# Patient Record
Sex: Male | Born: 1995 | Race: Black or African American | Hispanic: No | Marital: Single | Smoking: Never smoker
Health system: Southern US, Community
[De-identification: ages and names within clinical notes are randomized; demographics above are authoritative.]

## PROBLEM LIST (undated history)

## (undated) DIAGNOSIS — L309 Dermatitis, unspecified: Secondary | ICD-10-CM

## (undated) DIAGNOSIS — J302 Other seasonal allergic rhinitis: Secondary | ICD-10-CM

---

## 2014-10-12 ENCOUNTER — Emergency Department (HOSPITAL_COMMUNITY)
Admission: EM | Admit: 2014-10-12 | Discharge: 2014-10-13 | Disposition: A | Discharge status: Other Acute Inpatient Hospital | Payer: BC Managed Care – PPO | Attending: Emergency Medicine | Admitting: Emergency Medicine

## 2014-10-12 ENCOUNTER — Encounter (HOSPITAL_COMMUNITY): Payer: Self-pay | Admitting: Emergency Medicine

## 2014-10-12 DIAGNOSIS — F28 Other psychotic disorder not due to a substance or known physiological condition: Secondary | ICD-10-CM

## 2014-10-12 DIAGNOSIS — F1210 Cannabis abuse, uncomplicated: Secondary | ICD-10-CM | POA: Diagnosis not present

## 2014-10-12 DIAGNOSIS — F29 Unspecified psychosis not due to a substance or known physiological condition: Secondary | ICD-10-CM | POA: Diagnosis not present

## 2014-10-12 DIAGNOSIS — Z008 Encounter for other general examination: Secondary | ICD-10-CM | POA: Diagnosis present

## 2014-10-12 DIAGNOSIS — F1910 Other psychoactive substance abuse, uncomplicated: Secondary | ICD-10-CM

## 2014-10-12 LAB — CBC WITH DIFFERENTIAL/PLATELET
Basophils Absolute: 0 10*3/uL (ref 0.0–0.1)
Basophils Relative: 0 % (ref 0–1)
Eosinophils Absolute: 0 10*3/uL (ref 0.0–0.7)
Eosinophils Relative: 0 % (ref 0–5)
HCT: 48.4 % (ref 39.0–52.0)
Hemoglobin: 16.2 g/dL (ref 13.0–17.0)
Lymphocytes Relative: 6 % — ABNORMAL LOW (ref 12–46)
Lymphs Abs: 0.5 10*3/uL — ABNORMAL LOW (ref 0.7–4.0)
MCH: 31.2 pg (ref 26.0–34.0)
MCHC: 33.5 g/dL (ref 30.0–36.0)
MCV: 93.3 fL (ref 78.0–100.0)
Monocytes Absolute: 0.4 10*3/uL (ref 0.1–1.0)
Monocytes Relative: 4 % (ref 3–12)
Neutro Abs: 8.1 10*3/uL — ABNORMAL HIGH (ref 1.7–7.7)
Neutrophils Relative %: 90 % — ABNORMAL HIGH (ref 43–77)
Platelets: 187 10*3/uL (ref 150–400)
RBC: 5.19 MIL/uL (ref 4.22–5.81)
RDW: 12.3 % (ref 11.5–15.5)
WBC: 9 10*3/uL (ref 4.0–10.5)

## 2014-10-12 LAB — COMPREHENSIVE METABOLIC PANEL
ALT: 16 U/L (ref 0–53)
AST: 34 U/L (ref 0–37)
Albumin: 4.6 g/dL (ref 3.5–5.2)
Alkaline Phosphatase: 43 U/L (ref 39–117)
Anion gap: 14 (ref 5–15)
BUN: 9 mg/dL (ref 6–23)
CO2: 21 mmol/L (ref 19–32)
Calcium: 9.3 mg/dL (ref 8.4–10.5)
Chloride: 104 mmol/L (ref 96–112)
Creatinine, Ser: 1.16 mg/dL (ref 0.50–1.35)
GFR calc Af Amer: >90 mL/min (ref >90)
GFR calc non Af Amer: >90 mL/min (ref >90)
Glucose, Bld: 131 mg/dL — ABNORMAL HIGH (ref 70–99)
Potassium: 3.6 mmol/L (ref 3.5–5.1)
Sodium: 139 mmol/L (ref 135–145)
Total Bilirubin: 2.2 mg/dL — ABNORMAL HIGH (ref 0.3–1.2)
Total Protein: 7.3 g/dL (ref 6.0–8.3)

## 2014-10-12 LAB — RAPID URINE DRUG SCREEN, HOSP PERFORMED
Amphetamines: NOT DETECTED
Barbiturates: NOT DETECTED
Benzodiazepines: NOT DETECTED
Cocaine: NOT DETECTED
Opiates: NOT DETECTED
Tetrahydrocannabinol: POSITIVE — AB

## 2014-10-12 LAB — ETHANOL: Alcohol, Ethyl (B): <5 mg/dL (ref 0–9)

## 2014-10-12 MED ORDER — LORAZEPAM 2 MG/ML IJ SOLN
1.0000 mg | Freq: Once | INTRAMUSCULAR | Status: AC
  Administered 2014-10-12: 1 mg via INTRAVENOUS
  Filled 2014-10-12: qty 1
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
## 2014-10-12 NOTE — ED Notes (Signed)
PA at bedside.
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
## 2014-10-12 NOTE — [facility] Counselor (Signed)
Per Donell SievertSpencer Simon, PA, pt to be observed overnight and evaluated in the AM by psychiatry.  TTS Counselor informed attending PA, Junius FinnerErin O'Malley, of disposition. She reports that pt will be IVC'ed.   Cyndie MullAnna Yitzchok Carriger, [facility] Triage Specialist

## 2014-10-13 ENCOUNTER — Inpatient Hospital Stay (HOSPITAL_COMMUNITY)
Admission: AD | Admit: 2014-10-13 | Discharge: 2014-10-19 | DRG: 897 | Disposition: A | Discharge status: Home / Self Care | Payer: BC Managed Care – PPO | Source: Intra-hospital | Attending: Psychiatry | Admitting: Psychiatry

## 2014-10-13 ENCOUNTER — Encounter (HOSPITAL_COMMUNITY): Payer: Self-pay | Admitting: *Deleted

## 2014-10-13 DIAGNOSIS — F1220 Cannabis dependence, uncomplicated: Secondary | ICD-10-CM | POA: Diagnosis present

## 2014-10-13 DIAGNOSIS — F28 Other psychotic disorder not due to a substance or known physiological condition: Secondary | ICD-10-CM | POA: Diagnosis not present

## 2014-10-13 DIAGNOSIS — J302 Other seasonal allergic rhinitis: Secondary | ICD-10-CM | POA: Diagnosis present

## 2014-10-13 DIAGNOSIS — F1990 Other psychoactive substance use, unspecified, uncomplicated: Secondary | ICD-10-CM | POA: Diagnosis not present

## 2014-10-13 DIAGNOSIS — R17 Unspecified jaundice: Secondary | ICD-10-CM

## 2014-10-13 DIAGNOSIS — F12251 Cannabis dependence with psychotic disorder with hallucinations: Secondary | ICD-10-CM | POA: Diagnosis present

## 2014-10-13 DIAGNOSIS — F12122 Cannabis abuse with intoxication with perceptual disturbance: Secondary | ICD-10-CM | POA: Diagnosis not present

## 2014-10-13 DIAGNOSIS — F12129 Cannabis abuse with intoxication, unspecified: Secondary | ICD-10-CM | POA: Diagnosis not present

## 2014-10-13 DIAGNOSIS — F29 Unspecified psychosis not due to a substance or known physiological condition: Secondary | ICD-10-CM | POA: Diagnosis present

## 2014-10-13 DIAGNOSIS — F12951 Cannabis use, unspecified with psychotic disorder with hallucinations: Secondary | ICD-10-CM | POA: Diagnosis not present

## 2014-10-13 DIAGNOSIS — Y909 Presence of alcohol in blood, level not specified: Secondary | ICD-10-CM | POA: Diagnosis not present

## 2014-10-13 DIAGNOSIS — F12988 Cannabis use, unspecified with other cannabis-induced disorder: Secondary | ICD-10-CM | POA: Diagnosis not present

## 2014-10-13 DIAGNOSIS — F1099 Alcohol use, unspecified with unspecified alcohol-induced disorder: Secondary | ICD-10-CM | POA: Diagnosis not present

## 2014-10-13 DIAGNOSIS — F1010 Alcohol abuse, uncomplicated: Secondary | ICD-10-CM | POA: Diagnosis present

## 2014-10-13 DIAGNOSIS — M79672 Pain in left foot: Secondary | ICD-10-CM | POA: Diagnosis present

## 2014-10-13 DIAGNOSIS — R609 Edema, unspecified: Secondary | ICD-10-CM

## 2014-10-13 HISTORY — DX: Dermatitis, unspecified: L30.9

## 2014-10-13 HISTORY — DX: Other seasonal allergic rhinitis: J30.2

## 2014-10-13 MED ORDER — MAGNESIUM HYDROXIDE 400 MG/5ML PO SUSP: 30.0000 mL | Freq: Every day | ORAL | Status: DC | PRN

## 2014-10-13 MED ORDER — IBUPROFEN 200 MG PO TABS: 600.0000 mg | ORAL_TABLET | Freq: Three times a day (TID) | ORAL | Status: DC | PRN

## 2014-10-13 MED ORDER — HYDROXYZINE HCL 25 MG PO TABS
25.0000 mg | ORAL_TABLET | Freq: Four times a day (QID) | ORAL | Status: DC | PRN
  Administered 2014-10-14: 25 mg via ORAL
  Filled 2014-10-13: qty 1

## 2014-10-13 MED ORDER — RISPERIDONE 2 MG PO TBDP: 2.0000 mg | ORAL_TABLET | Freq: Three times a day (TID) | ORAL | Status: DC | PRN

## 2014-10-13 MED ORDER — TRAZODONE HCL 50 MG PO TABS
50.0000 mg | ORAL_TABLET | Freq: Every evening | ORAL | Status: DC | PRN
  Administered 2014-10-13 – 2014-10-14 (×2): 50 mg via ORAL
  Filled 2014-10-13 (×7): qty 1

## 2014-10-13 MED ORDER — ONDANSETRON HCL 4 MG PO TABS: 4.0000 mg | ORAL_TABLET | Freq: Three times a day (TID) | ORAL | Status: DC | PRN

## 2014-10-13 MED ORDER — SILVER SULFADIAZINE 1 % EX CREA
1.0000 "application " | TOPICAL_CREAM | Freq: Two times a day (BID) | CUTANEOUS | Status: DC
  Filled 2014-10-13: qty 85

## 2014-10-13 MED ORDER — LORAZEPAM 1 MG PO TABS: 1.0000 mg | ORAL_TABLET | ORAL | Status: DC | PRN

## 2014-10-13 MED ORDER — ZIPRASIDONE MESYLATE 20 MG IM SOLR: 20.0000 mg | INTRAMUSCULAR | Status: DC | PRN

## 2014-10-13 MED ORDER — SILVER SULFADIAZINE 1 % EX CREA
1.0000 "application " | TOPICAL_CREAM | Freq: Two times a day (BID) | CUTANEOUS | Status: DC
  Administered 2014-10-13 – 2014-10-14 (×3): 1 via TOPICAL
  Filled 2014-10-13 (×2): qty 50
  Filled 2014-10-13: qty 400

## 2014-10-13 MED ORDER — DIPHENHYDRAMINE HCL 25 MG PO CAPS
50.0000 mg | ORAL_CAPSULE | Freq: Four times a day (QID) | ORAL | Status: DC | PRN
  Administered 2014-10-13: 50 mg via ORAL
  Filled 2014-10-13: qty 2

## 2014-10-13 MED ORDER — ACETAMINOPHEN 325 MG PO TABS: 650.0000 mg | ORAL_TABLET | ORAL | Status: DC | PRN

## 2014-10-13 MED ORDER — ALUM & MAG HYDROXIDE-SIMETH 200-200-20 MG/5ML PO SUSP: 30.0000 mL | ORAL | Status: DC | PRN

## 2014-10-13 NOTE — ED Notes (Addendum)
Staffing notified of sitter need. EMT at beside.

## 2014-10-13 NOTE — ED Notes (Signed)
Parents aware pt being moved to [facility] tonight and that [facility] will transport

## 2014-10-13 NOTE — ED Notes (Signed)
Parents in to speak to [employer] Sciences Corporationconrad

## 2014-10-13 NOTE — [facility] Counselor (Signed)
TTS Counselor called to have tele-assessment cart placed in pt's room for evaluation. Counselor also reviewed pt's chart and PA-C's note in preparation for TTS assessment.   Assessment to begin shortly.    Cyndie MullAnna Nakiya Rallis, [facility] Triage Specialist
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
## 2014-10-13 NOTE — ED Notes (Signed)
[employer] notified of patient's transport needs. States will be here as soon as possible. No ETA.

## 2014-10-13 NOTE — ED Notes (Signed)
[facility]. Spoke to [employer] Ashley.

## 2014-10-13 NOTE — Progress Notes (Signed)
[facility] per Minerva AreolaEric.  Alexander [name], LCSWA Disposition staff 10/13/2014 7:35 PM

## 2014-10-13 NOTE — ED Notes (Signed)
Staffing sitter at bedside.  

## 2014-10-13 NOTE — ED Notes (Signed)
telepsych in progress with conrad np. Pt parents are here and are waiting due to conrad wanting to speak with them after talking to the patient

## 2014-10-13 NOTE — ED Notes (Signed)
[facility]

## 2014-10-14 ENCOUNTER — Encounter (HOSPITAL_COMMUNITY): Payer: Self-pay | Admitting: *Deleted

## 2014-10-14 ENCOUNTER — Other Ambulatory Visit: Payer: Self-pay

## 2014-10-14 ENCOUNTER — Ambulatory Visit (HOSPITAL_COMMUNITY)
Admission: RE | Admit: 2014-10-14 | Discharge: 2014-10-14 | Disposition: A | Discharge status: Home / Self Care | Payer: BC Managed Care – PPO | Source: Ambulatory Visit | Attending: Psychiatry | Admitting: Psychiatry

## 2014-10-14 DIAGNOSIS — M79672 Pain in left foot: Secondary | ICD-10-CM | POA: Insufficient documentation

## 2014-10-14 DIAGNOSIS — R17 Unspecified jaundice: Secondary | ICD-10-CM

## 2014-10-14 DIAGNOSIS — F1220 Cannabis dependence, uncomplicated: Secondary | ICD-10-CM | POA: Diagnosis present

## 2014-10-14 DIAGNOSIS — F1990 Other psychoactive substance use, unspecified, uncomplicated: Secondary | ICD-10-CM | POA: Diagnosis present

## 2014-10-14 DIAGNOSIS — F1010 Alcohol abuse, uncomplicated: Secondary | ICD-10-CM | POA: Diagnosis present

## 2014-10-14 DIAGNOSIS — F12251 Cannabis dependence with psychotic disorder with hallucinations: Secondary | ICD-10-CM | POA: Diagnosis present

## 2014-10-14 DIAGNOSIS — J302 Other seasonal allergic rhinitis: Secondary | ICD-10-CM | POA: Diagnosis present

## 2014-10-14 DIAGNOSIS — F12122 Cannabis abuse with intoxication with perceptual disturbance: Secondary | ICD-10-CM

## 2014-10-14 LAB — LIPID PANEL
CHOLESTEROL: 135 mg/dL (ref 0–200)
HDL: 49 mg/dL (ref >39)
LDL Cholesterol: 74 mg/dL (ref 0–99)
Total CHOL/HDL Ratio: 2.8 RATIO
Triglycerides: 58 mg/dL (ref <150)
VLDL: 12 mg/dL (ref 0–40)

## 2014-10-14 LAB — TSH: TSH: 2.322 u[IU]/mL (ref 0.350–4.500)

## 2014-10-14 MED ORDER — BENZTROPINE MESYLATE 0.5 MG PO TABS
0.5000 mg | ORAL_TABLET | Freq: Every day | ORAL | Status: DC
  Administered 2014-10-14 – 2014-10-17 (×4): 0.5 mg via ORAL
  Filled 2014-10-14 (×6): qty 1

## 2014-10-14 MED ORDER — SILVER SULFADIAZINE 1 % EX CREA
1.0000 "application " | TOPICAL_CREAM | Freq: Every day | CUTANEOUS | Status: DC
  Administered 2014-10-15 – 2014-10-19 (×5): 1 via TOPICAL

## 2014-10-14 MED ORDER — IBUPROFEN 600 MG PO TABS
600.0000 mg | ORAL_TABLET | Freq: Four times a day (QID) | ORAL | Status: DC | PRN
  Administered 2014-10-14 – 2014-10-18 (×4): 600 mg via ORAL
  Filled 2014-10-14 (×4): qty 1

## 2014-10-14 MED ORDER — HALOPERIDOL 5 MG PO TABS
5.0000 mg | ORAL_TABLET | Freq: Every day | ORAL | Status: DC
  Administered 2014-10-14 – 2014-10-18 (×5): 5 mg via ORAL
  Filled 2014-10-14 (×7): qty 1

## 2014-10-14 MED ORDER — INFLUENZA VAC SPLIT QUAD 0.5 ML IM SUSY
0.5000 mL | PREFILLED_SYRINGE | INTRAMUSCULAR | Status: DC
  Filled 2014-10-14: qty 0.5
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
## 2014-10-14 NOTE — Progress Notes (Signed)
D: Pt asleep at present. Respirations noted and unlabored.   A: Emotional support and availability offered. Medications administered as per MD's orders. Dressing change with Silverdine cream done with NP's assistance. Writer did give pt a specimen cup for urine sample and offer fluids. Q 15 minutes checks maintained for self injury without gestures / event of self injurious behavior to note at this time.   R: Pt has been cooperative with care. Denied SI / HI / AVH when assessed. Med compliant. Tolerated dressing change well. Remains med compliant. [facility]. Abdominal ultrasound was not done this shift because pt was not NPO prior to going to radiology for the procedure. Pt was verbally educated by Clinical research associatewriter on the procedure and rational of being NPO when [facility]. Safety maintained on / off unit on current observation level.

## 2014-10-14 NOTE — Tx Team (Signed)
Initial Interdisciplinary Treatment Plan   PATIENT STRESSORS: Loss of relationship with friends Substance abuse Traumatic event   PATIENT STRENGTHS: Ability for insight Communication skills Motivation for treatment/growth Religious Affiliation Supportive family/friends   PROBLEM LIST: Problem List/Patient Goals Date to be addressed Date deferred Reason deferred Estimated date of resolution  Paychosis 03/03816                                                      DISCHARGE CRITERIA:  Improved stabilization in mood, thinking, and/or behavior Motivation to continue treatment in a less acute level of care Verbal commitment to aftercare and medication compliance  PRELIMINARY DISCHARGE PLAN: Attend PHP/IOP Participate in family therapy Return to previous living arrangement Return to previous work or school arrangements  PATIENT/FAMIILY INVOLVEMENT: This treatment plan has been presented to and reviewed with the patient, [name], and/or family member.  The patient and family have been given the opportunity to ask questions and make suggestions.  [name], Nitin Mckowen [facility] 10/14/2014, 12:40 AM

## 2014-10-14 NOTE — Progress Notes (Signed)
Called [facility] to discuss abnormal bilirubin level 2.2.  Consult visit pending.

## 2014-10-14 NOTE — [facility] Suicide Risk Assessment (Signed)
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 Substance use disorder [F19.90] 10/14/2014  . Alcohol use disorder, mild, abuse [F10.10] 10/14/2014  . Cannabis use disorder, moderate, dependence [F12.20] 10/14/2014  . Seasonal allergies [J30.2]      Continued Clinical Symptoms:    The "Alcohol Use Disorders Identification Test", Guidelines for Use in Primary Care, Second Edition.  World Science writerHealth Organization [facility](WHO). Score between 0-7:  no or low risk or alcohol related problems. Score between 8-15:  moderate risk of alcohol related problems. Score between 16-19:  high risk of alcohol related problems. Score 20 or above:  warrants further diagnostic evaluation for alcohol dependence and treatment.   CLINICAL FACTORS:   Alcohol/Substance Abuse/Dependencies Currently Psychotic   Psychiatric Specialty Exam: Physical Exam  ROS  Blood pressure 140/64, pulse 81, temperature 98.8 F (37.1 C), temperature source Oral, resp. rate 16, height 6' 5.5" (1.969 m), weight 92.987 kg (205 lb).Body mass index is 23.98 kg/(m^2).    Please see H&P.  SUICIDE RISK:   Minimal: No identifiable suicidal ideation.  Patients presenting with no risk factors but with morbid ruminations; may be classified as minimal risk based on the severity of the depressive symptoms  PLAN OF CARE: Please see H&P.   Medical Decision Making:  Review of Psycho-Social Stressors (1), Review or  order clinical lab tests (1), New Problem, with no additional work-up planned (3), Review of Last Therapy Session (1), Review of Medication Regimen & Side Effects (2) and Review of New Medication or Change in Dosage (2)  I certify that inpatient services furnished can reasonably be expected to improve the patient's condition.   Matalie Romberger MD 10/14/2014, 12:39 PM

## 2014-10-14 NOTE — [facility] Group Notes (Signed)
[facility] Group Notes:  (Counselor/Nursing/MHT/Case Management/Adjunct)  10/14/2014 1:15PM  Type of Therapy:  Group Therapy  Participation Level:  Was engaged in animated conversation with family at the time of group.  Did not come to group.  Summary of Progress/Problems: The topic for group was balance in life.  Pt participated in the discussion about when their life was in balance and out of balance and how this feels.  Pt discussed ways to get back in balance and short term goals they can work on to get where they want to be.    Alexander [name], Alexander [name] B 10/14/2014 11:28 AM

## 2014-10-14 NOTE — Plan of Care (Signed)
Problem: Ineffective individual coping Goal: STG-Increase in ability to manage activities of daily living Outcome: Progressing Pt showered and changed his clothes this shift.
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
## 2014-10-15 ENCOUNTER — Ambulatory Visit (HOSPITAL_COMMUNITY)
Admission: AD | Admit: 2014-10-15 | Discharge: 2014-10-15 | Disposition: A | Discharge status: Home / Self Care | Payer: BC Managed Care – PPO | Source: Intra-hospital | Attending: Internal Medicine | Admitting: Internal Medicine

## 2014-10-15 ENCOUNTER — Other Ambulatory Visit (HOSPITAL_COMMUNITY): Payer: Self-pay

## 2014-10-15 ENCOUNTER — Ambulatory Visit (HOSPITAL_COMMUNITY): Payer: BC Managed Care – PPO

## 2014-10-15 DIAGNOSIS — F1010 Alcohol abuse, uncomplicated: Secondary | ICD-10-CM

## 2014-10-15 DIAGNOSIS — F1220 Cannabis dependence, uncomplicated: Secondary | ICD-10-CM

## 2014-10-15 LAB — HEPATITIS PANEL, ACUTE
HCV AB: NEGATIVE
HEP A IGM: NONREACTIVE
Hep B C IgM: NONREACTIVE
Hepatitis B Surface Ag: NEGATIVE

## 2014-10-15 LAB — HEMOGLOBIN A1C
Hgb A1c MFr Bld: 5.6 % (ref 4.8–5.6)
Mean Plasma Glucose: 114 mg/dL

## 2014-10-15 LAB — CBC WITH DIFFERENTIAL/PLATELET
BASOS ABS: 0 10*3/uL (ref 0.0–0.1)
BASOS PCT: 0 % (ref 0–1)
Eosinophils Absolute: 0.3 10*3/uL (ref 0.0–0.7)
Eosinophils Relative: 4 % (ref 0–5)
HEMATOCRIT: 51.8 % (ref 39.0–52.0)
Hemoglobin: 17.2 g/dL — ABNORMAL HIGH (ref 13.0–17.0)
LYMPHS PCT: 18 % (ref 12–46)
Lymphs Abs: 1.4 10*3/uL (ref 0.7–4.0)
MCH: 31 pg (ref 26.0–34.0)
MCHC: 33.2 g/dL (ref 30.0–36.0)
MCV: 93.5 fL (ref 78.0–100.0)
Monocytes Absolute: 0.7 10*3/uL (ref 0.1–1.0)
Monocytes Relative: 8 % (ref 3–12)
NEUTROS ABS: 5.4 10*3/uL (ref 1.7–7.7)
Neutrophils Relative %: 70 % (ref 43–77)
PLATELETS: 217 10*3/uL (ref 150–400)
RBC: 5.54 MIL/uL (ref 4.22–5.81)
RDW: 12.3 % (ref 11.5–15.5)
WBC: 7.8 10*3/uL (ref 4.0–10.5)

## 2014-10-15 LAB — PROTIME-INR
INR: 1.09 (ref 0.00–1.49)
PROTHROMBIN TIME: 14.2 s (ref 11.6–15.2)

## 2014-10-15 LAB — TSH: TSH: 3.268 u[IU]/mL (ref 0.350–4.500)

## 2014-10-15 LAB — COMPREHENSIVE METABOLIC PANEL
ALBUMIN: 4.8 g/dL (ref 3.5–5.2)
ALK PHOS: 50 U/L (ref 39–117)
ALT: 14 U/L (ref 0–53)
AST: 19 U/L (ref 0–37)
Anion gap: 9 (ref 5–15)
BUN: 9 mg/dL (ref 6–23)
CO2: 28 mmol/L (ref 19–32)
Calcium: 9.4 mg/dL (ref 8.4–10.5)
Chloride: 100 mmol/L (ref 96–112)
Creatinine, Ser: 0.96 mg/dL (ref 0.50–1.35)
GFR calc Af Amer: >90 mL/min (ref >90)
GFR calc non Af Amer: >90 mL/min (ref >90)
Glucose, Bld: 113 mg/dL — ABNORMAL HIGH (ref 70–99)
POTASSIUM: 3.5 mmol/L (ref 3.5–5.1)
Sodium: 137 mmol/L (ref 135–145)
TOTAL PROTEIN: 8.2 g/dL (ref 6.0–8.3)
Total Bilirubin: 1.7 mg/dL — ABNORMAL HIGH (ref 0.3–1.2)

## 2014-10-15 LAB — LACTATE DEHYDROGENASE: LDH: 195 U/L (ref 94–250)

## 2014-10-15 LAB — LIPID PANEL
CHOLESTEROL: 143 mg/dL (ref 0–200)
HDL: 48 mg/dL (ref >39)
LDL CALC: 81 mg/dL (ref 0–99)
TRIGLYCERIDES: 69 mg/dL (ref <150)
Total CHOL/HDL Ratio: 3 RATIO
VLDL: 14 mg/dL (ref 0–40)

## 2014-10-15 MED ORDER — TRAMADOL HCL 50 MG PO TABS
50.0000 mg | ORAL_TABLET | ORAL | Status: AC
  Administered 2014-10-15 – 2014-10-16 (×3): 50 mg via ORAL
  Filled 2014-10-15 (×3): qty 1

## 2014-10-15 MED ORDER — TRAZODONE HCL 100 MG PO TABS
100.0000 mg | ORAL_TABLET | Freq: Every day | ORAL | Status: DC
  Administered 2014-10-15 – 2014-10-17 (×3): 100 mg via ORAL
  Filled 2014-10-15 (×4): qty 1

## 2014-10-15 NOTE — [facility] Group Notes (Signed)
[facility] LCSW Group Therapy  10/15/2014 1:25 PM  Type of Therapy:  Group Therapy  Participation Level:  Did Not Attend  Modes of Intervention:  Discussion, Socialization and Support  Summary of Progress/Problems:Feelings around Relapse. Group members discussed the meaning of relapse and shared personal stories of relapse, how it affected them and others, and how they perceived themselves during this time. Group members were encouraged to identify triggers, warning signs and coping skills used when facing the possibility of relapse. Social supports were discussed and explored in detail.   [name],Alexander 10/15/2014, 1:25 PM

## 2014-10-15 NOTE — Progress Notes (Signed)
D: Pt denies SI/HI/AVH. Pt is pleasant and cooperative. Pt sitting in room talking to parents earlier in the evening. Pt appears very logical and seems worried about not being able to go back to [employer] because parents might not allow him due to the influences there.   A: Pt was offered support and encouragement. Pt was given scheduled medications. Pt was encourage to attend groups. Q 15 minute checks were done for safety. Educated pt on the dangers of drugs, and being around a lot of people in that atmosphere and the fact that anyone could put unknown substances in your drink, you could be given Drugs that could be laced with unknown substances.   R:Pt attends groups and interacts well with peers and staff. Pt is taking medication. Pt has no complaints.Pt receptive to treatment and safety maintained on unit. Pt appeared to be concerned about putting himself into dangerous situations

## 2014-10-15 NOTE — [facility] Group Notes (Signed)
[facility] LCSW Aftercare Discharge Planning Group Note   10/15/2014 11:38 AM  Participation Quality:  Invited. Did not attend.    Hyatt,Candace   

## 2014-10-15 NOTE — Consult Note (Signed)
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 Elevated bilirubin. Initial lab work performed in the inpatient psychiatric facility showed a total bilirubin of 2.2.-->1.73/4/16 LDH is non-elevated at 195 ruling out hemolysis as an etiology He appears to be asymptomatic, on physical examination had a completely benign abdomen.  2. Isolated lab abnormality could be related to Esaw Grandchild with plans per psychiatry to rule out other ingestions such as K2, bath salts CT CET C. Right upper quadrant ultrasound is negative  Hospitalist service will sign off , thanks for consult  Chief Complaint: Abnormal bilirubin  HPI:  Patient is a 19 year old gentleman with no significant past medical history who was admitted to the inpatient psychiatric service on 10/14/2014, presenting with paranoia, delusions and auditory hallucinations. He is currently a Consulting civil engineer at [employer]. Patient reporting that he can read minds, and is suspicious of his friends who took his "body" to his dorm room where he laid in a comatose state for several days. Lab work showing a total bilirubin of 2.2. Patient denies fevers, chills, abdominal pain, nausea, vomiting, jaundice, diarrhea, constipation, recent travel, sick contacts.   Review of Systems:  Difficult to obtain a reliable review of systems, patient appears to be delusional, exhibiting paranoia.   Past Medical History   Diagnosis Date  . Eczema   . Seasonal allergies    History reviewed. No pertinent past surgical history. Social History:  reports that he has never smoked. He has never used smokeless tobacco. He reports that he uses illicit drugs (Marijuana and Other-see comments). He reports that he does not drink alcohol.  No Known Allergies History reviewed. No pertinent family history.  Prior to Admission medications   Not on File   Physical Exam: Blood pressure 140/64, pulse 81, temperature 98.8 F (37.1 C), temperature source Oral, resp. rate 16, height 6' 5.5" (1.969 m), weight 92.987 kg (205 lb). Filed Vitals:   10/13/14 2250  BP: 140/64  Pulse: 81  Temp: 98.8 F (37.1 C)  Resp: 16     General:  Patient does not appear to be in acute distress although appears to have some pressured speech, actively delusional.  Eyes: Pupils equal round and reactive to light  ENT: Supple symmetrical no jugular venous distention  Cardiovascular: Regular rate and rhythm normal S1-S2 no murmurs rubs or gallops  Respiratory: Clear to auscultation bilaterally no wheezing rhonchi or rales  Abdomen: Soft nontender nondistended positive bowel sounds  Skin: Patient having escalations over chest region, bilateral knees face  Labs on Admission:  Basic Metabolic Panel:  Recent Labs Lab 10/12/14 2108 10/15/14 0622  NA 139 137  K 3.6 3.5  CL 104 100  CO2 21 28  GLUCOSE 131* 113*  BUN 9 9  CREATININE 1.16 0.96  CALCIUM 9.3 9.4   Liver Function Tests:  Recent Labs Lab 10/12/14 2108 10/15/14 0622  AST 34 19  ALT 16 14  ALKPHOS 43 50  BILITOT 2.2* 1.7*  PROT 7.3 8.2  ALBUMIN 4.6 4.8   No results for input(s): LIPASE,  AMYLASE in the last 168 hours. No results for input(s): AMMONIA in the last 168 hours. CBC:  Recent Labs Lab 10/12/14 2108 10/15/14 0622  WBC 9.0 7.8  NEUTROABS 8.1* 5.4  HGB 16.2 17.2*  HCT 48.4 51.8  MCV 93.3 93.5  PLT 187 217   Cardiac Enzymes: No results  for input(s): CKTOTAL, CKMB, CKMBINDEX, TROPONINI in the last 168 hours. BNP: Invalid input(s): POCBNP CBG: No results for input(s): GLUCAP in the last 168 hours.  Radiological Exams on Admission: [place] Abdomen Complete  10/15/2014   CLINICAL DATA:  Elevated bilirubin  EXAM: ULTRASOUND ABDOMEN COMPLETE  COMPARISON:  None.  FINDINGS: Gallbladder: No gallstones or wall thickening visualized. No sonographic Murphy sign noted.  Common bile duct: Diameter: 6 mm  Liver: No focal lesion identified. Within normal limits in parenchymal echogenicity.  IVC: No abnormality visualized.  Pancreas: Visualized portion unremarkable.  Spleen: Size and appearance within normal limits.  Right Kidney: Length: 11.1 cm. Echogenicity within normal limits. No mass or hydronephrosis visualized.  Left Kidney: Length: 10.1 cm. Echogenicity within normal limits. No mass or hydronephrosis visualized.  Abdominal aorta: No aneurysm visualized.  Other findings: There is no ascites.  IMPRESSION: Normal abdominal ultrasound examination.   Electronically Signed   By: David  [place]   On: 10/15/2014 16:25   Dg Foot 2 Views Left  10/14/2014   CLINICAL DATA:  Left heel pain while running 2 days ago  EXAM: LEFT FOOT - 2 VIEW  COMPARISON:  None.  FINDINGS: The bones of the left foot are adequately mineralized. There is no acute fracture nor dislocation. The joint spaces are preserved. There are no abnormal soft tissue calcifications and there is no significant soft tissue swelling. There is no spurring of the calcaneus.  IMPRESSION: There is no acute bony abnormality of the left foot.   Electronically Signed   By: David  [place]   On: 10/14/2014 15:18    EKG: Independently reviewed.   Time spent: 15 minutes  Pleas KochJai Iqra Rotundo, MD [facility]) [phone]  If 7PM-7AM, please contact night-coverage [email or web address] Password [email or web address] 10/15/2014, 5:05 PM

## 2014-10-15 NOTE — Progress Notes (Signed)
Alexander [name]) has spent a good part of the day in his room resting. States that he does not feel well and that his abdomen is hurting. Was sent over for an ultra sound of his abdomen accompanied by a staff member and two police officers. Affect is flat and mood depressed. Pt denies SI and HI, delusions and hallucinations. States "I am very tired and need to rest".  A) Given support, reassurance and praise. Encouragement provided. Instructions given not to eat anything prior to the test. R) Pt denies SI and HI. No delusions, ideas of reference or hallucinations

## 2014-10-15 NOTE — Plan of Care (Signed)
Problem: Alteration in thought process Goal: STG-Patient does not respond to command hallucinations Outcome: Progressing Pt denies AH at this time  Problem: Ineffective individual coping Goal: LTG: Patient will report a decrease in negative feelings Outcome: Progressing Pt stated he was feeling better today than he did yesterday
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
## 2014-10-16 DIAGNOSIS — F1990 Other psychoactive substance use, unspecified, uncomplicated: Secondary | ICD-10-CM

## 2014-10-16 DIAGNOSIS — Y909 Presence of alcohol in blood, level not specified: Secondary | ICD-10-CM

## 2014-10-16 DIAGNOSIS — F12951 Cannabis use, unspecified with psychotic disorder with hallucinations: Secondary | ICD-10-CM

## 2014-10-16 DIAGNOSIS — F1099 Alcohol use, unspecified with unspecified alcohol-induced disorder: Secondary | ICD-10-CM

## 2014-10-16 LAB — HEMOGLOBIN A1C
Hgb A1c MFr Bld: 5.5 % (ref 4.8–5.6)
Mean Plasma Glucose: 111 mg/dL

## 2014-10-16 MED ORDER — HYDROCERIN EX CREA
TOPICAL_CREAM | Freq: Two times a day (BID) | CUTANEOUS | Status: DC
  Administered 2014-10-17 – 2014-10-19 (×4): via TOPICAL
  Filled 2014-10-16: qty 113

## 2014-10-16 NOTE — [facility] Group Notes (Signed)
[facility] Group Notes:  (Clinical Social Work)  10/16/2014  11:15-12:00PM  Summary of Progress/Problems:   The main focus of today's process group was to discuss patients' feelings about hospitalization, the stigma attached to mental health, and sources of motivation to stay well.  We then worked to identify a specific plan to avoid future hospitalizations when discharged from the hospital for this admission.  The patient expressed little during the early part of group, although he was smiling, relaxed and pleasant.  He left group, however, and did not return.  Type of Therapy:  Group Therapy - Process  Participation Level:  Minimal  Participation Quality:  Inattentive  Affect:  Appropriate  Cognitive:  Could not assess  Insight:  Could not assess  Engagement in Therapy:  Limited  Modes of Intervention:  Exploration, Discussion  Alexander MantleMareida Grossman-Orr, LCSW 10/16/2014, 12:53 PM

## 2014-10-16 NOTE — Progress Notes (Signed)
Adult Psychoeducational Group Note  Date:  10/16/2014 Time:  9:09 PM  Group Topic/Focus:  Wrap-Up Group:   The focus of this group is to help patients review their daily goal of treatment and discuss progress on daily workbooks.  Participation Level:  Active  Participation Quality:  Appropriate  Affect:  Appropriate  Cognitive:  Appropriate  Insight: Appropriate  Engagement in Group:  Engaged  Modes of Intervention:  Discussion  Additional Comments: The patient attended the group about weather.The also said that he is feeling a lot better.  Octavio Mannshigpen, [employer] Veasey Lee 10/16/2014, 9:09 PM

## 2014-10-16 NOTE — Progress Notes (Signed)
D) Pt denies SI and HI. States he is feeling much better overall. Areas to his torso, legs and left side are healing. Pt denies auditory and visual hallucinations. States I am 99% back to myself. Interacting with his peers appropriately. Attending the program. A) Given support, reassurance and praise. Encouragement provided. Dressing change for Pt's wounds and medication applied. Provided Pt with a 1:1. R) Pt denies SI and HI.
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
## 2014-10-17 MED ORDER — BENZTROPINE MESYLATE 1 MG PO TABS
1.0000 mg | ORAL_TABLET | Freq: Once | ORAL | Status: AC
  Administered 2014-10-17: 1 mg via ORAL
  Filled 2014-10-17: qty 1

## 2014-10-17 MED ORDER — BENZTROPINE MESYLATE 1 MG PO TABS
ORAL_TABLET | ORAL | Status: AC
  Administered 2014-10-17: 12:00:00
  Filled 2014-10-17: qty 1

## 2014-10-17 MED ORDER — BENZTROPINE MESYLATE 1 MG PO TABS
1.0000 mg | ORAL_TABLET | Freq: Every day | ORAL | Status: DC
  Administered 2014-10-18 – 2014-10-19 (×2): 1 mg via ORAL
  Filled 2014-10-17 (×5): qty 1

## 2014-10-17 MED ORDER — BENZTROPINE MESYLATE 1 MG/ML IJ SOLN
1.0000 mg | Freq: Once | INTRAMUSCULAR | Status: DC
  Filled 2014-10-17: qty 1
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
## 2014-10-17 NOTE — [facility] Group Notes (Signed)
[facility] Group Notes:  (Clinical Social Work)  10/17/2014   11:15am-12:00pm  Summary of Progress/Problems:  The main focus of today's process group was to listen to a variety of genres of music and to identify that different types of music provoke different responses.  The patient then was able to identify personally what was soothing for them, as well as energizing.  Handouts were used to record feelings evoked, as well as how patient can personally use this knowledge in sleep habits, with depression, and with other symptoms.  The patient expressed understanding of concepts, as well as knowledge of how each type of music affected him/her and how this can be used at home as a wellness/recovery tool.  He interacted well and seemed to enjoy the music, until his hand started hurting.  He eventually ended up leaving group due to the hand pain, and when he returned, it continued to hurt so he left again, stayed out.  Type of Therapy:  Music Therapy   Participation Level:  Active  Participation Quality:  Attentive and Sharing  Affect:  Blunted  Cognitive:  Oriented  Insight:  Engaged  Engagement in Therapy:  Engaged  Modes of Intervention:   Activity, Exploration  Alexander MantleMareida Grossman-Orr, LCSW 10/17/2014, 12:30pm
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
## 2014-10-17 NOTE — Plan of Care (Signed)
Problem: Ineffective individual coping Goal: STG: Patient will remain free from self harm Outcome: Progressing Pt has not harmed himself this evening.  He denies thoughts of self-harm/SI and verbally contracts for safety.

## 2014-10-17 NOTE — Progress Notes (Signed)
D) Pt came to this writer and stated that his right hand was 'getting very stiff and it is painful and i can't stop it from getting stiff". A) verbal order received from the NP to give 1 mg of Cogentin now and then to change Cogentin to 1 mg BID. Pt was given the 1 mg of Cogentin and was still having difficulty after returning from lunch stating "it is getting worse" Another order obtained for an IM injection of 1 mg of Cogentin.  R) Pt stated that he was now fine and he no longer felt any stiffness. Injection of 1 mg now of Cogentin held.

## 2014-10-17 NOTE — Progress Notes (Signed)
To note:  Patient did not receive one time IM shot of Cogentin for EPS.  The oral dose was sufficient and effective.

## 2014-10-17 NOTE — Progress Notes (Signed)
D: Pt has depressed affect and anxious mood.  Pt reports his day has been "great."  Pt reports he had a good visit with his parents this evening.  He told Probation officer about his day, reporting he went to the cafeteria for meals, attended groups, and played basketball in the gym.  Pt denies SI/HI, denies hallucinations.  He was more visible in the milieu this evening and attended evening group. A: Medications administered per order.  PRN medication administered for pain, see flowsheet.  Safety maintained.  Met with pt 1:1 and provided support and encouragement.   R: Pt is compliant with medications.  He verbally contracts for safety.  Will continue to monitor and assess for safety.

## 2014-10-17 NOTE — Progress Notes (Signed)
Adult Psychoeducational Group Note  Date:  10/17/2014 Time:  9:03 PM  Group Topic/Focus:  Wrap-Up Group:   The focus of this group is to help patients review their daily goal of treatment and discuss progress on daily workbooks.  Participation Level:  Active  Participation Quality:  Appropriate  Affect:  Appropriate  Cognitive:  Appropriate  Insight: Appropriate  Engagement in Group:  Engaged  Modes of Intervention:  Discussion  Additional Comments: The patient expressed that his healthy support system is his family.The patient also said tha the had a good day.  Alexander [name], Alexander [name] 10/17/2014, 9:03 PM

## 2014-10-17 NOTE — Progress Notes (Signed)
D) Pt has been interactive in the milieu. Affect is flat but brightens with interaction. States "I am feeling back to myself. I will never smoke pot again". Pt denies SI and HI, delusions, hallucinations or ideas of reference. Attending the program and interaction with his peers appropriately. A) Given support, reassurance and praise. Encouragement provided. Provided a 1:1 with the Pt and his family.  R) Pt denies SI and HI, delusions, hallucinations, ideas of reference and is no longer religiously preoccupied.

## 2014-10-17 NOTE — Progress Notes (Signed)
Patient ID: Alexander [name], male   DOB: 07-30-1996, 19 y.o.   MRN: [id number]   D: Pt is quiet but pleasant and cooperative. Informed the writer that he's scheduled for discharge tomorrow. States he plans to go back to  [place] and transfer his classes to [employer] [place] in order to start summer session. Pt attempted to discuss events preceding his adm to [facility], however the writer found it difficult to follow.   A:  Support and encouragement was offered. 15 min checks continued for safety.  R: Pt remains safe.

## 2014-10-18 DIAGNOSIS — F12988 Cannabis use, unspecified with other cannabis-induced disorder: Secondary | ICD-10-CM

## 2014-10-18 MED ORDER — TRAZODONE HCL 150 MG PO TABS
150.0000 mg | ORAL_TABLET | Freq: Every day | ORAL | Status: DC
  Administered 2014-10-18: 150 mg via ORAL
  Filled 2014-10-18 (×3): qty 1

## 2014-10-18 MED ORDER — HALOPERIDOL 2 MG PO TABS
4.0000 mg | ORAL_TABLET | Freq: Every day | ORAL | Status: DC
  Administered 2014-10-19: 4 mg via ORAL
  Filled 2014-10-18 (×3): qty 2
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
## 2014-10-18 NOTE — Plan of Care (Signed)
Problem: Alteration in thought process Goal: STG-Patient does not respond to command hallucinations Outcome: Progressing Patient does not appear to respond to internal stimuli and is denying AVH Goal: STG-Patient is able to sleep at least 6 hours per night Outcome: Not Progressing Patient has only slept 4 hours the past 2 nights.

## 2014-10-18 NOTE — Progress Notes (Signed)
Patient ID: Alexander [name], male   DOB: 02/02/1996, 19 y.o.   MRN: [id number] Patient ID: Alexander [name], male   DOB: 02-22-1996, 19 y.o.   MRN: [id number] [facility] MD Progress Note  10/18/2014 12:46 PM Alexander [name]  MRN:  [id number] Subjective:  Patient states, " I am fine, I feel I am back to normal.'  Objective:  Patient seen and chart reviewed. Patient discussed with treatment team. Patient presented with psychosis . Patient with cannabis as well as alcohol abuse and its likely that his recent presentation was substance induced. Patient today is alert , oriented x3, ,answers questions appropriately and denies any concerns or questions. Patient has dressing on his chest, abdomen as well as his BL knees, wound healing well , no discharge noted, dressing clean.  Patient denies SI/AH/VH/HI.  Patient was observed to be in group therapy and interacting and smiling with other patients on unit.    Spoke to mother Alexander [name] at [phone] - who reported that she taking patient home in [place] and wanted after care appointments scheduled in [place]. CSW will work on this. Possible DC tomorrow.      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 Substance use disorder [F19.90] 10/14/2014  . Alcohol use disorder, mild, abuse [F10.10] 10/14/2014  . Cannabis use disorder, moderate, dependence [F12.20] 10/14/2014  . Seasonal allergies [J30.2]    Total  Time spent with patient: 30 minutes   Past Medical History:  Past Medical History  Diagnosis Date  . Eczema   . Seasonal allergies    History reviewed. No pertinent past surgical history. Family History: History reviewed. No pertinent family history. Social History:  History  Alcohol Use No     History  Drug Use  . Yes  . Special: Marijuana, Other-see comments    History   Social History  . Marital Status: Single    Spouse Name: N/A  . Number of Children: N/A  . Years of Education: N/A   Social History Main Topics  . Smoking status: Never Smoker   . Smokeless tobacco: Never Used  . Alcohol Use: No  . Drug Use: Yes    Special: Marijuana, Other-see comments  . Sexual Activity: Yes   Other Topics Concern  . None   Social History Narrative   Additional History:    Sleep: Fair  Appetite:  Fair  Musculoskeletal: Strength & Muscle Tone: within normal limits Gait & Station: normal Patient leans: N/A   Psychiatric Specialty Exam: Physical Exam  Vitals reviewed. Skin:  Has dressing all over his chest , abdomen as well as BL knees, clean.    Review of Systems  Psychiatric/Behavioral: Positive for substance abuse. The patient has insomnia (improving ,but pain keeps him awake at night).     Blood pressure 165/86, pulse 69, temperature 97.8 F (36.6 C), temperature source Oral, resp. rate 20, height 6' 5.5" (1.969 m), weight 92.987 kg (205 lb).Body mass index is 23.98 kg/(m^2).  General Appearance:  Fair  Eye Contact::  Fair  Speech:  Normal Rate  Volume:  Decreased  Mood:  Anxious improving  Affect:  Congruent  Thought Process:  Irrelevant  Orientation:  Full (Time, Place, and Person)  Thought Content:  WDL  Suicidal Thoughts:  No  Homicidal Thoughts:  No  Memory:  Immediate;   Fair Recent;   Fair Remote;   Fair  Judgement:  Impaired  Insight:  Fair  Psychomotor Activity:  Normal  Concentration:  Fair  Recall:  [employer]  Fund of Knowledge:Fair   Language: Fair  Akathisia:  No  Handed:  Right  AIMS (if indicated):     Assets:  Social Support  ADL's:  Intact  Cognition: WNL  Sleep:  Number of Hours: 4     Current Medications: Current Facility-Administered Medications  Medication Dose Route Frequency Provider Last Rate Last Dose  . benztropine (COGENTIN) tablet 1 mg  1 mg Oral Daily Adonis BrookSheila Agustin, NP   1 mg at 10/18/14 0747  . benztropine mesylate (COGENTIN) injection 1 mg  1 mg Intramuscular Once Adonis BrookSheila Agustin, NP   1 mg at 10/17/14 1305  . diphenhydrAMINE (BENADRYL) capsule 50 mg  50 mg Oral Q6H PRN Kerry HoughSpencer E Simon, PA-C   50 mg at 10/13/14 2314  . [START ON 10/19/2014] haloperidol (HALDOL) tablet 4 mg  4 mg Oral Daily Ardith Test, MD      . hydrocerin (EUCERIN) cream   Topical BID Adonis BrookSheila Agustin, NP      . hydrOXYzine (ATARAX/VISTARIL) tablet 25 mg  25 mg Oral Q6H PRN Kerry HoughSpencer E Simon, PA-C   25 mg at 10/14/14 2202  . ibuprofen (ADVIL,MOTRIN) tablet 600 mg  600 mg Oral Q6H PRN Shuvon B Rankin, NP   600 mg at 10/17/14 0625  . Influenza vac split quadrivalent PF (FLUARIX) injection 0.5 mL  0.5 mL Intramuscular Tomorrow-1000 Kerry HoughSpencer E Simon, PA-C   0.5 mL at 10/17/14 1937  . risperiDONE (RISPERDAL M-TABS) disintegrating tablet 2 mg  2 mg Oral Q8H PRN Kerry HoughSpencer E Simon, PA-C       And  . LORazepam (ATIVAN) tablet 1 mg  1 mg Oral PRN Kerry HoughSpencer E Simon, PA-C       And  . ziprasidone (GEODON) injection 20 mg  20 mg Intramuscular PRN Kerry HoughSpencer E Simon, PA-C      . magnesium hydroxide (MILK OF MAGNESIA) suspension 30 mL  30 mL Oral Daily PRN Kerry HoughSpencer E Simon, PA-C      . silver sulfADIAZINE (SILVADENE) 1 % cream 1 application  1 application Topical Daily Adonis BrookSheila Agustin, NP   1 application at 10/18/14 [phone]  . traZODone (DESYREL) tablet 150 mg  150 mg Oral QHS Jomarie LongsSaramma Aarik Blank, MD        Lab Results:  No results found for this or any previous visit (from the past 48 hour(s)).  Physical Findings: AIMS: Facial and Oral Movements Muscles  of Facial Expression: None, normal Lips and Perioral Area: None, normal Jaw: None, normal Tongue: None, normal,Extremity Movements Upper (arms, wrists, hands, fingers): None, normal Lower (legs, knees, ankles, toes): None, normal, Trunk Movements Neck, shoulders, hips: None, normal, Overall Severity Severity of abnormal movements (highest score from questions above): None, normal Incapacitation due to abnormal movements: None, normal Patient's awareness of abnormal movements (rate only patient's report): No Awareness, Dental Status Current problems with teeth and/or dentures?: No Does patient usually wear dentures?: No  CIWA:  CIWA-Ar Total: 0 COWS:         Assessment: Patient is a 19 year old AAM with hx of cannabis abuse, presented very disorganized as well as confused  after being at a party with friends. Patient could have ingested unknown substance ?? , however patient is improving.Patient had an EPS reaction to Haldol over the week end. Denies any sx today. Discussed reducing the dose. Possible DC tomorrow.   Treatment Plan Summary: Daily contact with patient to assess and evaluate symptoms and progress in treatment and Medication management Will reduce Haldol to 4 mg po daily along with Cogentin 1 mg po daily. Increase Trazodone to 150 mg po qhs for sleep. Continue wound care as well as dressing change. Will continue to monitor vitals ,medication compliance and treatment side effects while patient is here.  Reviewed labs ,ordered miscellaneous test for k2.spice,flakka,bathsalts (per request from parents)- will await result. CSW will start working on disposition. Patient to be referred to a substance abuse program. Patient to participate in therapeutic milieu .    Medical Decision Making:  Review of Psycho-Social Stressors (1), Review or order clinical lab tests (1), Order AIMS Test (2), Review of Last Therapy Session (1), Independent Review of image, tracing or specimen (2),  Review of Medication Regimen & Side Effects (2) and Review of New Medication or Change in Dosage (2)  Jasmon Mattice md 10/18/2014, 12:46 PM

## 2014-10-18 NOTE — [facility] Group Notes (Signed)
[facility]   10/18/2014 2:57 PM  Participation Quality:  Engaged  Mood/Affect:  Appropriate  Depression Rating:  denies  Anxiety Rating:  denies  Thoughts of Suicide:  No Will you contract for safety?   NA  Current AVH:  No  Plan for Discharge/Comments:  States he had a great weekend.  Visitors daily.  He is ready to get out of here and get on with the next chapter of his life.  Asked for a letter for school.  Transportation Means: family  Supports: family  Alexander [name], Alexander [name]

## 2014-10-18 NOTE — Progress Notes (Signed)
The focus of this group is to help patients review their daily goal of treatment and discuss progress on daily workbooks. Pt attended the evening group session and responded to all discussion prompts from the Writer. Pt shared that today was a good day and that he was looking forward to discharging tomorrow. "I'm feeling ready for that, like I'm good now." Pt's only additional requests from Nursing Staff this evening was to receive towels and socks, which were given to him following group. Pt's affect was appropriate.
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
## 2014-10-18 NOTE — Progress Notes (Signed)
Patient initially upset and angry that he would not be discharged today. Pacing, cursing but was redirectable. Patient showered and dressing changes completed by this RN. Wounds continue to heal. No S&S of infection. Patient much calmer at this time and is presently headed to dinner with peers. Lawrence MarseillesFriedman, Trace Wirick Eakes

## 2014-10-19 DIAGNOSIS — F12129 Cannabis abuse with intoxication, unspecified: Secondary | ICD-10-CM

## 2014-10-19 MED ORDER — BENZTROPINE MESYLATE 1 MG PO TABS: 1.0000 mg | ORAL_TABLET | Freq: Every day | ORAL | Status: AC

## 2014-10-19 MED ORDER — HALOPERIDOL 2 MG PO TABS: 4.0000 mg | ORAL_TABLET | Freq: Every day | ORAL | Status: AC

## 2014-10-19 MED ORDER — TRAZODONE HCL 150 MG PO TABS: 150.0000 mg | ORAL_TABLET | Freq: Every day | ORAL | Status: AC

## 2014-10-19 NOTE — [facility] Group Notes (Signed)
[facility] Group Notes:  (Nursing/MHT/Case Management/Adjunct)  Date:  10/19/2014  Time:  0945am  Type of Therapy:  Nurse Education  Participation Level:  Active  Participation Quality:  Appropriate, Attentive  Affect:  Appropriate  Cognitive:  Alert and Appropriate  Insight:  Appropriate and Good  Engagement in Group:  Engaged  Modes of Intervention:  Discussion, Education and Support  Summary of Progress/Problems: Patient attended group, remained engaged and responded appropriately when prompted. Pt reports his goal for the day is "to go home and not do drugs."  Laurine BlazerGuthrie, [place]ada[place]ittany A 10/19/2014, 10:32 AM

## 2014-10-19 NOTE — Progress Notes (Signed)
D: Patient is alert and oriented. Pt's mood and affect is pleasant and appropriate to circumstance. Pt's eye contact is fair. Pt denies SI/HI and AVH. Pt reports he is ready to go home today. Pt rates his depression, hopelessness, and anxiety 0/10. Pt is attending groups. A: Active listening by RN. Encouragement/Support provided to pt. Scheduled medications administered per providers orders (See MAR). 15 minute checks continued per protocol for patient safety.  R: Patient cooperative and receptive to nursing interventions. Pt remains safe.

## 2014-10-19 NOTE — Tx Team (Signed)
  Interdisciplinary Treatment Plan Update   Date Reviewed:  10/19/2014  Time Reviewed:  8:36 AM  Progress in Treatment:   Attending groups: Yes Participating in groups: Yes Taking medication as prescribed: Yes  Tolerating medication: Yes Family/Significant other contact made: Yes  Patient understands diagnosis: Yes  Discussing patient identified problems/goals with staff: Yes  See initial care plan Medical problems stabilized or resolved: Yes Denies suicidal/homicidal ideation: Yes  In tx team Patient has not harmed self or others: Yes  For review of initial/current patient goals, please see plan of care.  Estimated Length of Stay:  D/C today  Reason for Continuation of Hospitalization:   New Problems/Goals identified:  N/A  Discharge Plan or Barriers:   return home, follow up outpt  Additional Comments:  Attendees:  Signature: Ivin BootySarama Eappen, MD 10/19/2014 8:36 AM   Signature: Richelle Itood Mattea Seger, LCSW 10/19/2014 8:36 AM  Signature: Fransisca KaufmannLaura Davis, NP 10/19/2014 8:36 AM  Signature: Joslyn Devonaroline Beaudry, RN 10/19/2014 8:36 AM  Signature: Liborio NixonPatrice White, RN 10/19/2014 8:36 AM  Signature:  10/19/2014 8:36 AM  Signature:   10/19/2014 8:36 AM  Signature:    Signature:    Signature:    Signature:    Signature:    Signature:      Scribe for Treatment Team:   Richelle Itood Isaiah Torok, LCSW  10/19/2014 8:36 AM

## 2014-10-19 NOTE — Progress Notes (Signed)
DISCHARGE NOTE: D: Patient was alert, oriented, in stable condition, and ambulatory with a steady gait upon discharge. Pt denies SI/HI and AVH. A: AVS reviewed and given to pt. Follow up reviewed with pt. Resources reviewed with pt, including [facility]. Prescriptions/Medications given to pt. Belongings returned to pt. Pt given time to ask questions and express concerns. R: Pt D/C'd to parents.

## 2014-10-19 NOTE — [facility] Suicide Risk Assessment (Signed)
[facility]   Demographic Factors:  Male  Total Time spent with patient: 30 minutes  Musculoskeletal: Strength & Muscle Tone: within normal limits Gait & Station: normal Patient leans: N/A  Psychiatric Specialty Exam: Physical Exam  Review of Systems  Psychiatric/Behavioral: Positive for substance abuse. Negative for depression, suicidal ideas and hallucinations. The patient is not nervous/anxious and does not have insomnia.     Blood pressure 142/78, pulse 77, temperature 98.4 F (36.9 C), temperature source Oral, resp. rate 20, height 6' 5.5" (1.969 m), weight 92.987 kg (205 lb).Body mass index is 23.98 kg/(m^2).  General Appearance: Casual  Eye Contact::  Fair  Speech:  Clear and Coherent409  Volume:  Normal  Mood:  Euthymic  Affect:  Congruent  Thought Process:  Coherent  Orientation:  Full (Time, Place, and Person)  Thought Content:  WDL  Suicidal Thoughts:  No  Homicidal Thoughts:  No  Memory:  Immediate;   Fair Recent;   Fair Remote;   Fair  Judgement:  Fair  Insight:  Fair  Psychomotor Activity:  Normal  Concentration:  Fair  Recall:  Fis[employer]Fair  Fund of Knowledge:Fair  Language: Fair  Akathisia:  No  Handed:  Right  AIMS (if indicated):     Assets:  Communication Skills Desire for Improvement Physical Health Social Support  Sleep:  Number of Hours: 5.75  Cognition: WNL  ADL's:  Intact      Has this patient used any form of tobacco in the last 30 days? (Cigarettes, Smokeless Tobacco, Cigars, and/or Pipes) No  Mental Status Per Nursing Assessment::   On Admission:  NA  Current Mental Status by Physician: patient denies SI/HI/AH/VH  Loss Factors: NA  Historical Factors: Impulsivity  Risk Reduction Factors:   Positive social support  Continued Clinical Symptoms:  Alcohol/Substance Abuse/Dependencies  Cognitive Features That Contribute To Risk:  None    Suicide Risk:  Minimal: No identifiable suicidal ideation.   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 Substance use disorder [F19.90] 10/14/2014  . Alcohol use disorder, mild, abuse [F10.10] 10/14/2014  . Cannabis use disorder, moderate, dependence [F12.20] 10/14/2014  . Seasonal allergies [J30.2]     Follow-up Information    [facility]  On 10/25/2014.   Why:  Monday at 9:30AM with Clent JacksHelen Hayes, Psychiatric Nurse Practitioner.  Call if you need to reschedule or cancel.   Contact information:   [address]  [place]  [phone]      Plan Of Care/Follow-up recommendations:  Activity:  NO RESTRICTIONS Diet:  REGULAR Tests:  PATIENT TO FOLLOW UP WITH PMD for follow up for his wounds Other:  follow up with after care appointments as scheduled.  Is patient on multiple antipsychotic therapies at discharge:  No   Has Patient had three or more failed trials of antipsychotic monotherapy by history:  No  Recommended Plan for Multiple Antipsychotic Therapies: NA    Aristide Waggle MD 10/19/2014, 9:46 AM

## 2014-10-19 NOTE — Plan of Care (Signed)
Problem: Alteration in thought process Goal: STG-Patient does not respond to command hallucinations Outcome: Progressing D: Pt denies any AVH. Pt does not appear to be responding to any internal stimuli at this time.

## 2014-10-19 NOTE — Progress Notes (Signed)
D: Pt reports readiness to discharge. " I'm good now". Pt observed calmly pacing the halls. Pt actively participated in group. Pt is currently denying any SI/HI/AVH. [facility] Pt voices that he was actually okay with staying at [facility]. Pt refuses a dressing change at this time.   A: Writer administered scheduled and prn medications to pt. Continued support and availability as needed was extended to this pt. Staff continue to monitor pt with q4915min checks.  R: No adverse drug reactions noted. Pt receptive to treatment. Pt remains safe at this time.
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
## 2014-10-20 NOTE — Progress Notes (Signed)
  [facility]charge [facility] :  Will you be returning to the same living situation after discharge:  No. At discharge, do you have transportation home?: Yes,  family Do you have the ability to pay for your medications: Yes,  insurance  Release of information consent forms completed and in the chart;  Patient's signature needed at discharge.  Patient to Follow up at: Follow-up Information    [facility]  On 10/25/2014.   Why:  Monday at 9:30AM with Clent JacksHelen Hayes, Psychiatric Nurse Practitioner.  Call if you need to reschedule or cancel.   Contact information:   [address]  [place]  [phone]      Patient denies SI/HI: Yes,  yes    Safety Planning and Suicide Prevention discussed: Yes,  yes     Has patient been referred to the Quitline?: Patient refused referral  Alexander [name], Alexander [name] 10/20/2014, 1:55 PM

## 2014-10-22 NOTE — Progress Notes (Signed)
Patient Discharge Instructions:  After Visit Summary (AVS):   Faxed to:  10/22/14 Discharge Summary Note:   Faxed to:  10/22/14 Psychiatric Admission Assessment Note:   Faxed to:  10/22/14 Suicide Risk Assessment - Discharge Assessment:   Faxed to:  10/22/14 Faxed/Sent to the Next Level Care provider:  10/22/14 Faxed to [facility] @ [phone]  Jerelene ReddenSheena E Benavides, 10/22/2014, 3:05 PM

## 2014-10-27 LAB — MISCELLANEOUS TEST

## 2014-10-28 LAB — MISCELLANEOUS TEST

## 2016-08-03 IMAGING — CR DG FOOT 2V*L*
2 series · 2 of 2 positions shown · non-contrast
Comparison: None.

CLINICAL DATA: Left heel pain while running 2 days ago

EXAM:
LEFT FOOT - 2 VIEW

[view not recorded (1 of 2)]
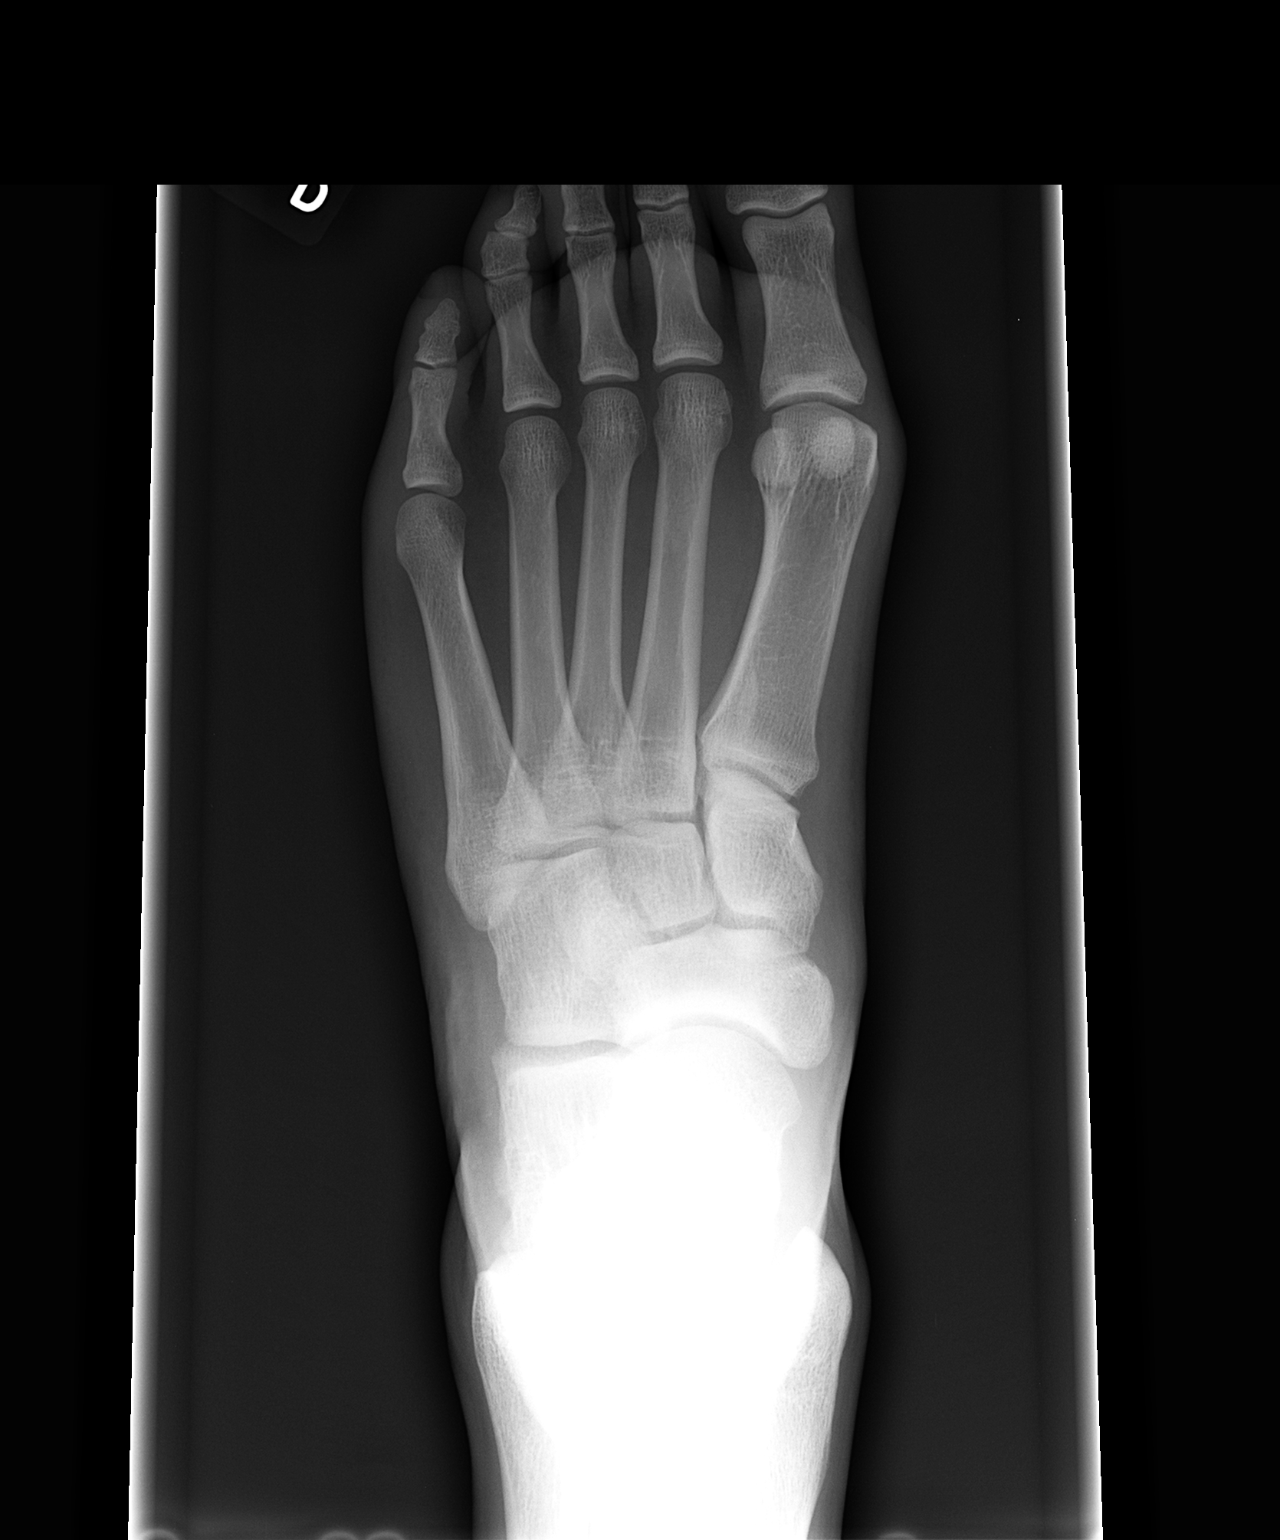

[view not recorded (2 of 2)]
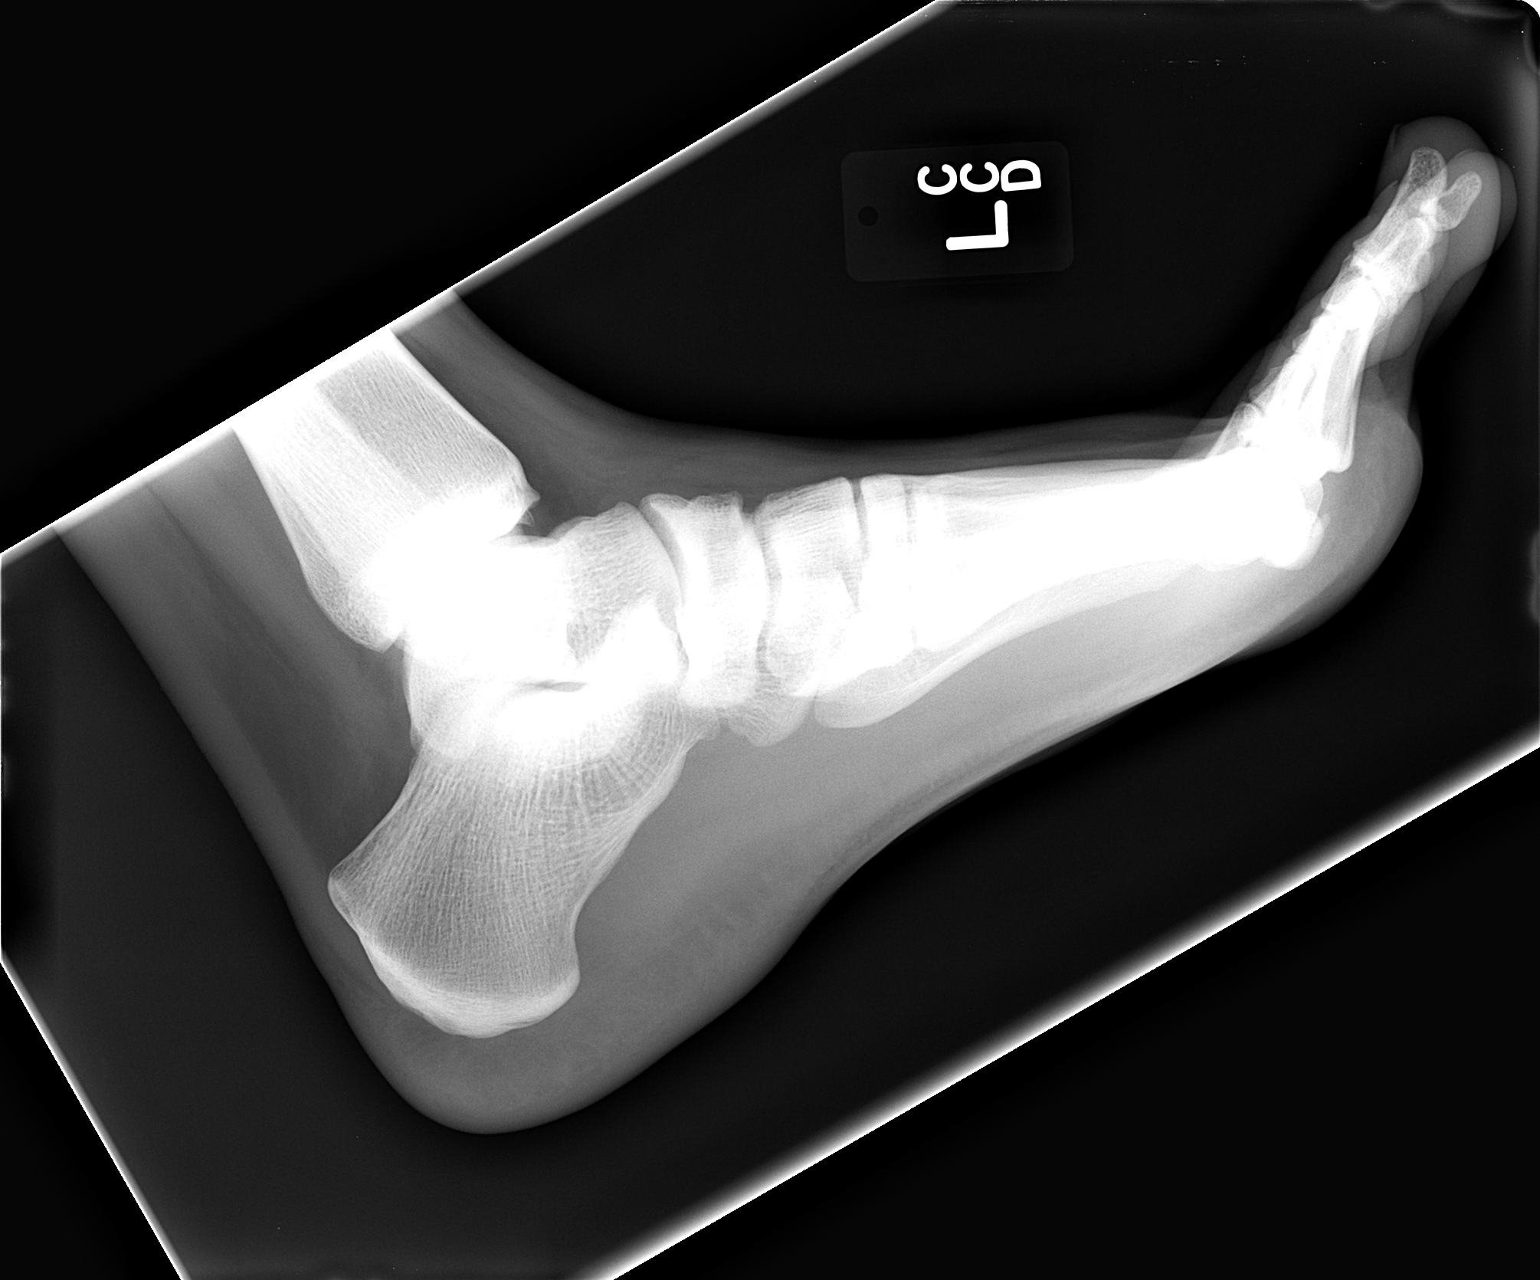

[2 of 2 positions shown; findings below may reference images not displayed]

FINDINGS: The bones of the left foot are adequately mineralized. There is no
acute fracture nor dislocation. The joint spaces are preserved.
There are no abnormal soft tissue calcifications and there is no
significant soft tissue swelling. There is no spurring of the
calcaneus.
IMPRESSION: There is no acute bony abnormality of the left foot.

## 2016-08-04 IMAGING — US US ABDOMEN COMPLETE
1 series · 14 of 25 positions shown · non-contrast
Comparison: None.

CLINICAL DATA: Elevated bilirubin

EXAM:
ULTRASOUND ABDOMEN COMPLETE

[Series 1: us abdomen complete · 0.17mm/px · 14 of 102 slices shown]
[im 1/102]
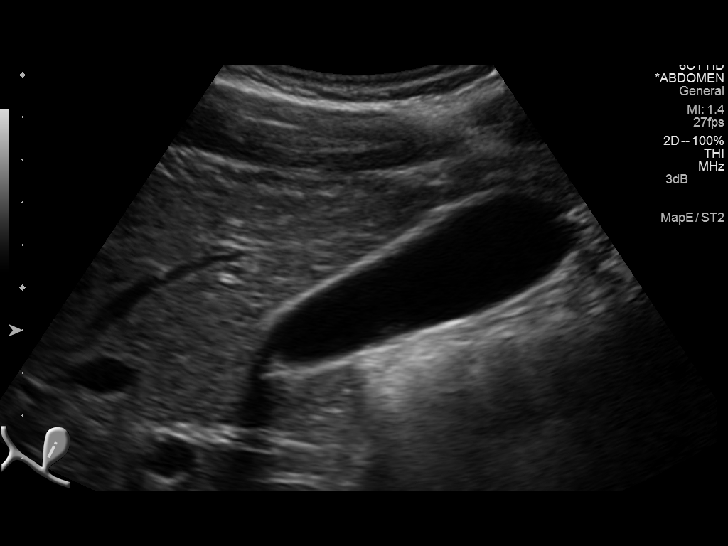
[im 9/102]
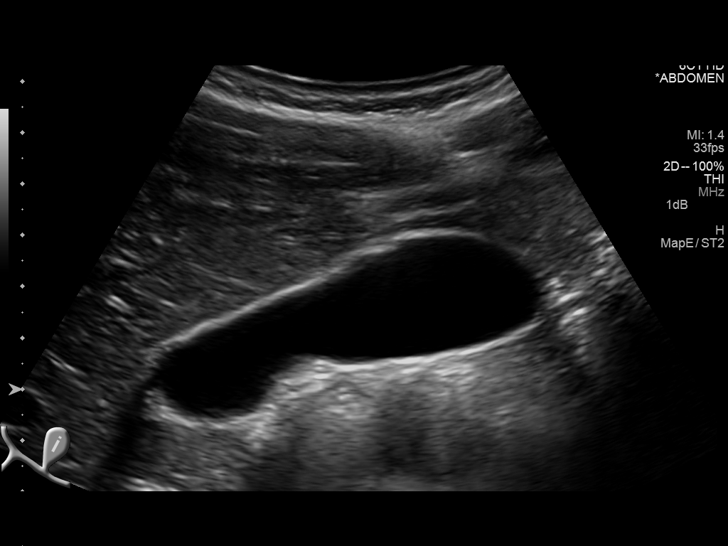
[im 17/102]
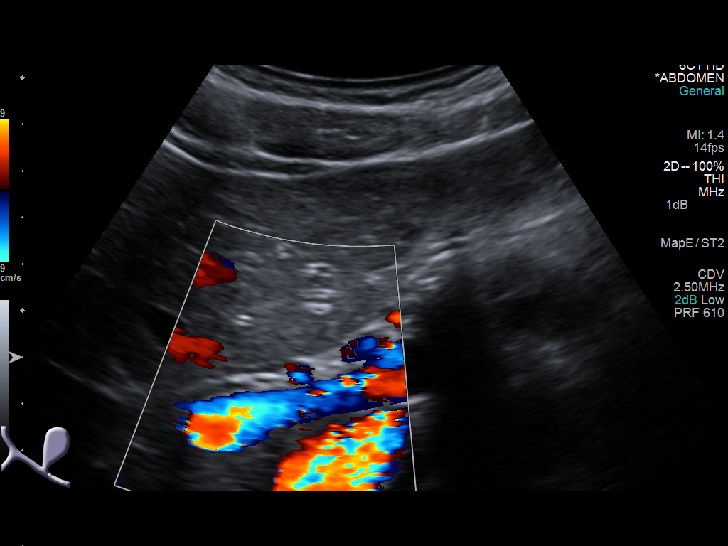
[im 26/102]
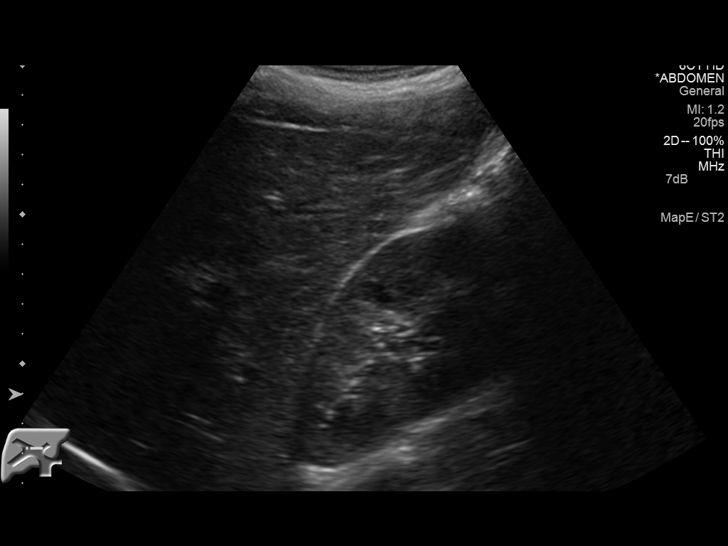
[im 34/102]
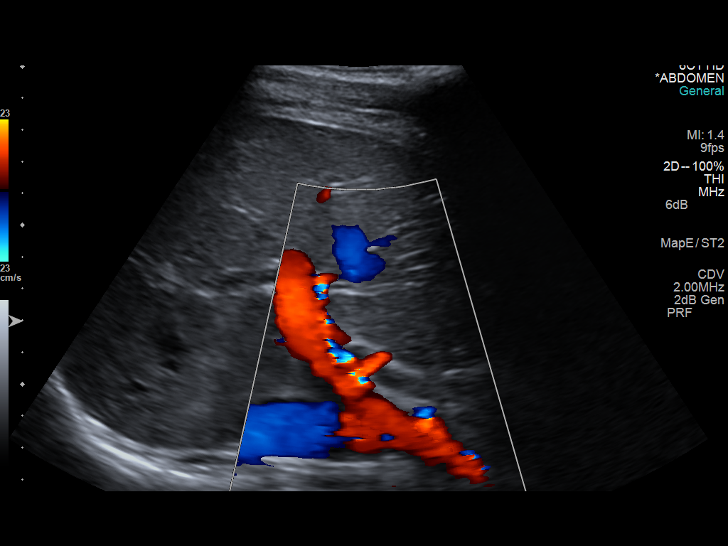
[im 38/102]
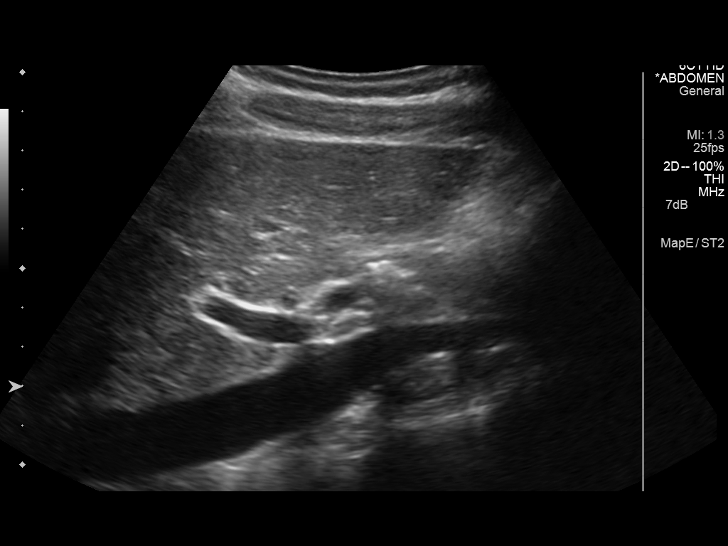
[im 47/102]
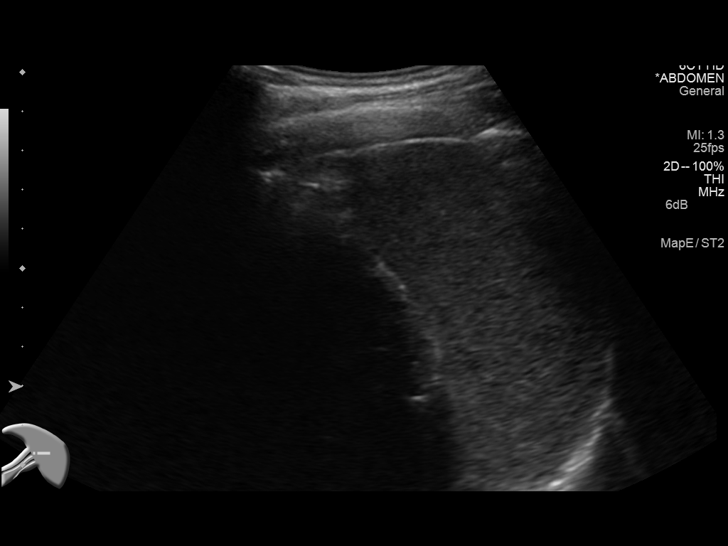
[im 55/102]
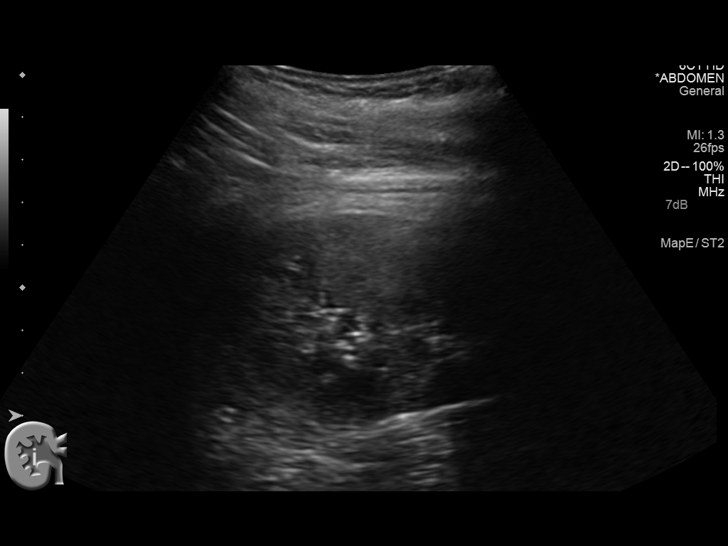
[im 64/102]
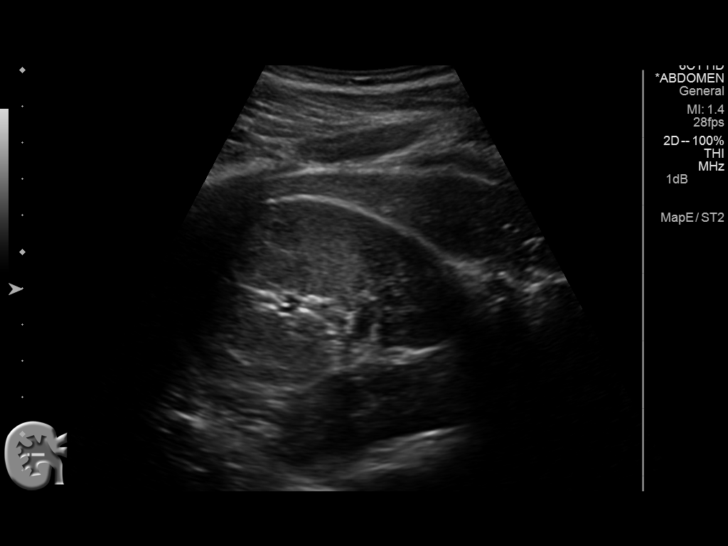
[im 68/102]
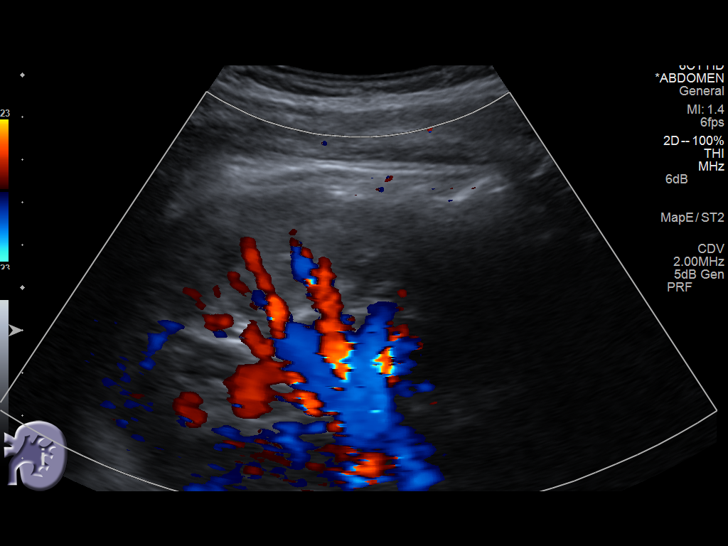
[im 76/102]
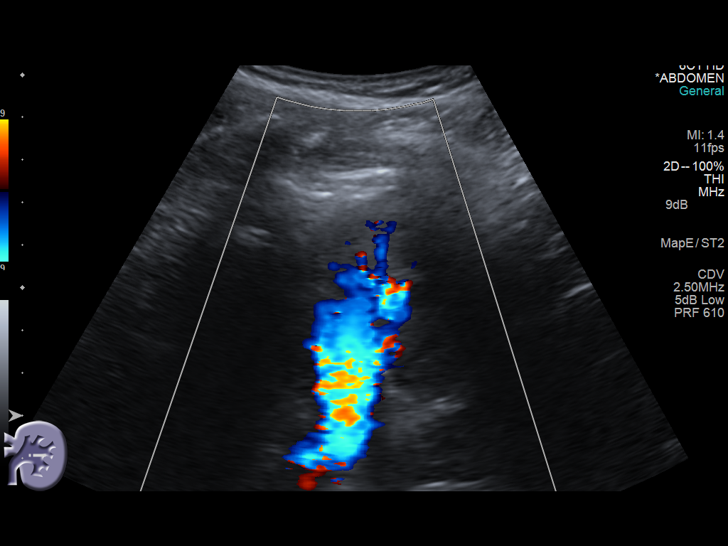
[im 85/102]
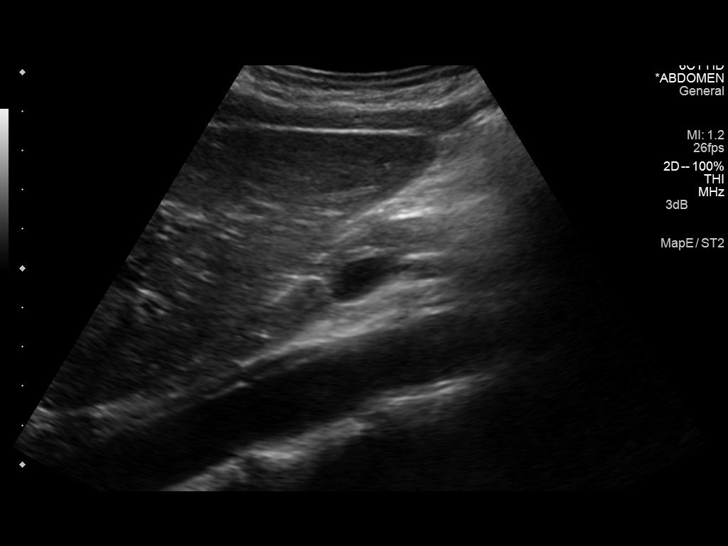
[im 93/102]
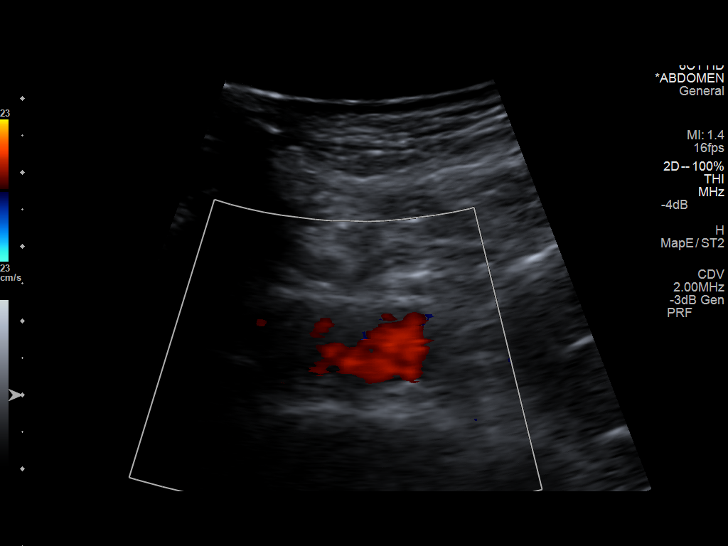
[im 102/102]
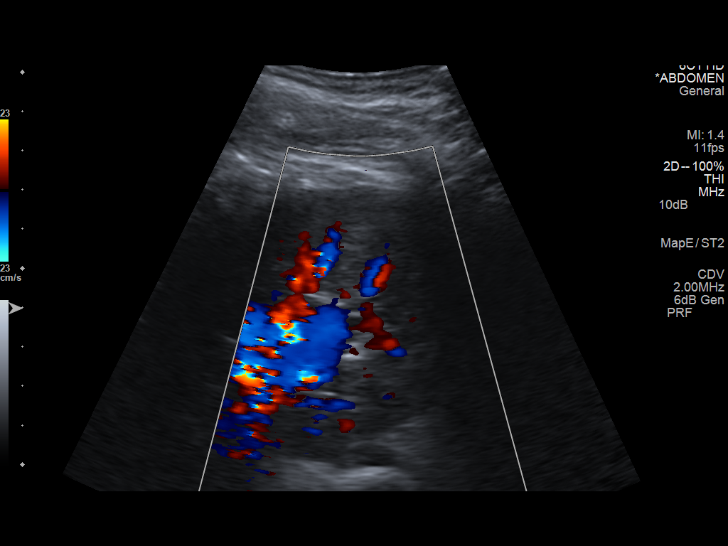

[14 of 25 positions shown; findings below may reference images not displayed]

FINDINGS: Gallbladder: No gallstones or wall thickening visualized. No
sonographic Murphy sign noted.

Common bile duct: Diameter: 6 mm

Liver: No focal lesion identified. Within normal limits in
parenchymal echogenicity.

IVC: No abnormality visualized.

Pancreas: Visualized portion unremarkable.

Spleen: Size and appearance within normal limits.

Right Kidney: Length: 11.1 cm. Echogenicity within normal limits. No
mass or hydronephrosis visualized.

Left Kidney: Length: 10.1 cm. Echogenicity within normal limits. No
mass or hydronephrosis visualized.

Abdominal aorta: No aneurysm visualized.

Other findings: There is no ascites.
IMPRESSION: Normal abdominal ultrasound examination.
